# Patient Record
Sex: Male | Born: 1985 | Race: Black or African American | Hispanic: No | Marital: Single | Smoking: Never smoker
Health system: Southern US, Community
[De-identification: ages and names within clinical notes are randomized; demographics above are authoritative.]

## PROBLEM LIST (undated history)

## (undated) HISTORY — PX: HERNIA REPAIR: SHX51

---

## 2011-10-08 ENCOUNTER — Emergency Department (INDEPENDENT_AMBULATORY_CARE_PROVIDER_SITE_OTHER): Payer: Managed Care, Other (non HMO)

## 2011-10-08 ENCOUNTER — Emergency Department (HOSPITAL_BASED_OUTPATIENT_CLINIC_OR_DEPARTMENT_OTHER)
Admission: EM | Admit: 2011-10-08 | Discharge: 2011-10-08 | Disposition: A | Discharge status: Home / Self Care | Payer: Managed Care, Other (non HMO) | Attending: Emergency Medicine | Admitting: Emergency Medicine

## 2011-10-08 ENCOUNTER — Encounter (HOSPITAL_BASED_OUTPATIENT_CLINIC_OR_DEPARTMENT_OTHER): Payer: Self-pay | Admitting: *Deleted

## 2011-10-08 DIAGNOSIS — Y9361 Activity, american tackle football: Secondary | ICD-10-CM | POA: Insufficient documentation

## 2011-10-08 DIAGNOSIS — M25579 Pain in unspecified ankle and joints of unspecified foot: Secondary | ICD-10-CM | POA: Insufficient documentation

## 2011-10-08 DIAGNOSIS — S82899A Other fracture of unspecified lower leg, initial encounter for closed fracture: Secondary | ICD-10-CM | POA: Insufficient documentation

## 2011-10-08 DIAGNOSIS — X500XXA Overexertion from strenuous movement or load, initial encounter: Secondary | ICD-10-CM | POA: Insufficient documentation

## 2011-10-08 DIAGNOSIS — M25473 Effusion, unspecified ankle: Secondary | ICD-10-CM | POA: Insufficient documentation

## 2011-10-08 DIAGNOSIS — S82409A Unspecified fracture of shaft of unspecified fibula, initial encounter for closed fracture: Secondary | ICD-10-CM

## 2011-10-08 DIAGNOSIS — M25476 Effusion, unspecified foot: Secondary | ICD-10-CM | POA: Insufficient documentation

## 2011-10-08 DIAGNOSIS — W219XXA Striking against or struck by unspecified sports equipment, initial encounter: Secondary | ICD-10-CM

## 2011-10-08 MED ORDER — OXYCODONE-ACETAMINOPHEN 5-325 MG PO TABS: 2.0000 | ORAL_TABLET | ORAL | Status: AC | PRN

## 2011-10-08 MED ORDER — OXYCODONE-ACETAMINOPHEN 5-325 MG PO TABS
2.0000 | ORAL_TABLET | Freq: Once | ORAL | Status: DC
  Filled 2011-10-08: qty 2

## 2011-10-08 MED ORDER — IBUPROFEN 800 MG PO TABS
800.0000 mg | ORAL_TABLET | Freq: Once | ORAL | Status: AC
  Administered 2011-10-08: 800 mg via ORAL
  Filled 2011-10-08: qty 1

## 2011-10-08 NOTE — ED Provider Notes (Signed)
History     CSN: [id number]  Arrival date & time 10/08/11  1655   First MD Initiated Contact with Patient 10/08/11 1721      Chief Complaint  Patient presents with  . Ankle Injury    (Consider location/radiation/quality/duration/timing/severity/associated sxs/prior treatment) HPI Comments: Pt states that he was playing football and he twisted his left ankle:pt denies any injury to the area previously:pt was able to walk on it  Patient is a 26 y.o. male presenting with lower extremity injury. The history is provided by the patient. No language interpreter was used.  Ankle Injury This is a new problem. The current episode started today. The problem occurs constantly. The problem has been unchanged.    History reviewed. No pertinent past medical history.  Past Surgical History  Procedure Date  . Hernia repair     History reviewed. No pertinent family history.  History  Substance Use Topics  . Smoking status: Never Smoker   . Smokeless tobacco: Not on file  . Alcohol Use: Yes      Review of Systems  All other systems reviewed and are negative.    Allergies  Review of patient's allergies indicates no known allergies.  Home Medications  No current outpatient prescriptions on file.  BP 132/83  Pulse 86  Temp(Src) 98 F (36.7 C) (Oral)  Resp 18  Ht 5\' 7"  (1.702 m)  Wt 158 lb (71.668 kg)  BMI 24.75 kg/m2  SpO2 97%  Physical Exam  Nursing note and vitals reviewed. Constitutional: He is oriented to person, place, and time. He appears well-developed and well-nourished.  HENT:  Head: Normocephalic.  Eyes: EOM are normal.  Neck: Neck supple.  Cardiovascular: Normal rate and regular rhythm.   Pulmonary/Chest: Effort normal and breath sounds normal.  Musculoskeletal: Normal range of motion.       Pt has obvious swelling noted above the ankle:pt has good JYN:WGNFAO intact:good cap refill  Neurological: He is alert and oriented to person, place, and time.  Skin:  Skin is warm and dry.  Psychiatric: He has a normal mood and affect.    ED Course  Procedures (including critical care time)  Labs Reviewed - No data to display Dg Ankle Complete Left  10/08/2011  *RADIOLOGY REPORT*  Clinical Data: Injured left ankle while playing football.  LEFT ANKLE COMPLETE - 3+ VIEW 10/08/2011:  Comparison: None.  Findings: Oblique, comminuted fracture involving the distal fibula, above the lateral malleolus.  Widening of the medial joint space. No other visible fractures.  Ankle mortise intact.  Dorsal soft tissue swelling.  IMPRESSION: Oblique, comminuted fracture involving the distal fibula, proximal to the lateral malleolus.  Widening of the medial joint space may indicate medial ligament disruption.  No other fractures.  Original Report Authenticated By: Arnell Sieving, M.D.     1. Fibula fracture       MDM  Pt splinted and can follow [id number] intact:pt given something for pain at home        Teressa Lower, NP 10/08/11 1806

## 2011-10-08 NOTE — Discharge Instructions (Signed)
Fibular Fracture, Adult, Treated Without Immobilization You have a fracture (break) of your fibula. This is the bone in your lower leg located on the outside of the leg. These fractures are easily diagnosed with x-rays. TREATMENT  You have a simple fracture of the part of the fibula which is located between the knee and ankle. This bone usually will heal without problems and can often be treated without casting or splinting. This means the fracture will heal well during normal use and daily activities without being held in place. Sometimes a cast or splint is placed on these fractures if it is needed for comfort or if the bones are badly out of place. HOME CARE INSTRUCTIONS   Apply ice to the injury for 15 to 20 minutes, 3 to 4 times per day while awake, for 2 days. Put the ice in a plastic bag and place a thin towel between the bag of ice and your leg. This helps keep swelling down.   Use crutches as directed. Resume walking without crutches as directed by your caregiver or when comfortable doing so.   Only take over-the-counter or prescription medicines for pain, discomfort, or fever as directed by your caregiver.   Your caregiver may tell you to take off your removable cam boot.   Keep appointments for follow up X-rays if these are required.   Warning: Do not drive a car or operate a motor vehicle until your caregiver specifically tells you it is safe to do so.  SEEK MEDICAL CARE IF:   You have continued severe pain or more swelling.   The medications do not control the pain.   Your skin or nails below the injury turn blue or grey or feel cold or numb.   You develop severe pain in the leg or foot.  MAKE SURE YOU:   Understand these instructions.   Will watch your condition.   Will get help right away if you are not doing well or get worse.  Document Released: 08/01/2005 Document Revised: 04/13/2011 Document Reviewed: 03/07/2008 [facility] Patient Information 2012 [place], [place].

## 2011-10-08 NOTE — ED Provider Notes (Signed)
Medical screening examination/treatment/procedure(s) were performed by non-physician practitioner and as supervising physician I was immediately available for consultation/collaboration.  Wessley Emert T Denzal Meir, MD 10/08/11 2025 

## 2011-10-08 NOTE — ED Notes (Signed)
Pt states he was playing football and ?twisted his left ankle. Move toes. Feels touch. Cap refill < 3 sec. Swelling noted.

## 2016-02-11 ENCOUNTER — Emergency Department (HOSPITAL_BASED_OUTPATIENT_CLINIC_OR_DEPARTMENT_OTHER)
Admission: EM | Admit: 2016-02-11 | Discharge: 2016-02-11 | Discharge status: Absent without leave | Payer: Worker's Compensation

## 2017-02-05 ENCOUNTER — Emergency Department (HOSPITAL_BASED_OUTPATIENT_CLINIC_OR_DEPARTMENT_OTHER)
Admission: EM | Admit: 2017-02-05 | Discharge: 2017-02-05 | Disposition: A | Discharge status: Home / Self Care | Payer: 59 | Attending: Emergency Medicine | Admitting: Emergency Medicine

## 2017-02-05 ENCOUNTER — Encounter (HOSPITAL_BASED_OUTPATIENT_CLINIC_OR_DEPARTMENT_OTHER): Payer: Self-pay | Admitting: Emergency Medicine

## 2017-02-05 DIAGNOSIS — Y939 Activity, unspecified: Secondary | ICD-10-CM | POA: Insufficient documentation

## 2017-02-05 DIAGNOSIS — S0990XA Unspecified injury of head, initial encounter: Secondary | ICD-10-CM | POA: Diagnosis present

## 2017-02-05 DIAGNOSIS — Y92410 Unspecified street and highway as the place of occurrence of the external cause: Secondary | ICD-10-CM | POA: Diagnosis not present

## 2017-02-05 DIAGNOSIS — Y999 Unspecified external cause status: Secondary | ICD-10-CM | POA: Diagnosis not present
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
## 2020-07-03 ENCOUNTER — Emergency Department (HOSPITAL_BASED_OUTPATIENT_CLINIC_OR_DEPARTMENT_OTHER)
Admission: EM | Admit: 2020-07-03 | Discharge: 2020-07-03 | Disposition: A | Discharge status: Home / Self Care | Payer: 59 | Attending: Emergency Medicine | Admitting: Emergency Medicine

## 2020-07-03 ENCOUNTER — Emergency Department (HOSPITAL_BASED_OUTPATIENT_CLINIC_OR_DEPARTMENT_OTHER): Payer: 59

## 2020-07-03 ENCOUNTER — Other Ambulatory Visit: Payer: Self-pay

## 2020-07-03 ENCOUNTER — Encounter (HOSPITAL_BASED_OUTPATIENT_CLINIC_OR_DEPARTMENT_OTHER): Payer: Self-pay | Admitting: *Deleted

## 2020-07-03 DIAGNOSIS — S99912A Unspecified injury of left ankle, initial encounter: Secondary | ICD-10-CM | POA: Diagnosis present

## 2020-07-03 DIAGNOSIS — S93402A Sprain of unspecified ligament of left ankle, initial encounter: Secondary | ICD-10-CM | POA: Diagnosis not present

## 2020-07-03 DIAGNOSIS — Y30XXXA Falling, jumping or pushed from a high place, undetermined intent, initial encounter: Secondary | ICD-10-CM | POA: Diagnosis not present

## 2020-07-03 DIAGNOSIS — T1490XA Injury, unspecified, initial encounter: Secondary | ICD-10-CM
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
## 2020-07-03 NOTE — Discharge Instructions (Signed)
Take over-the-counter medications such as Tylenol or ibuprofen. Use the crutches to stay off your ankle. Use the brace to help support your ankle. Follow-up with your orthopedic doctor for further evaluation

## 2020-07-03 NOTE — ED Notes (Signed)
Discharge instructions discussed with patient. Verbalized understanding. Departs ED at this time in stable condition.  
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
## 2020-07-03 NOTE — ED Notes (Signed)
Patient transported to CT 

## 2021-05-28 ENCOUNTER — Ambulatory Visit
Admission: EM | Admit: 2021-05-28 | Discharge: 2021-05-28 | Disposition: A | Discharge status: Home / Self Care | Payer: 59 | Attending: Emergency Medicine | Admitting: Emergency Medicine

## 2021-05-28 DIAGNOSIS — Z113 Encounter for screening for infections with a predominantly sexual mode of transmission: Secondary | ICD-10-CM

## 2021-05-28 NOTE — ED Provider Notes (Signed)
[facility]    CSN: [id number] Arrival date & time: 05/28/21  1202      History   Chief Complaint Chief Complaint  Patient presents with   SEXUALLY TRANSMITTED DISEASE    HPI Bruce Copeland is a 35 y.o. male.   Pt requesting to get STD tested. Pt denies having any symptoms at this time.  Patient reports multiple sex partners.  States he likes to get tested every year.  Patient politely declines HIV and syphilis screening at this time.  The history is provided by the patient.   History reviewed. No pertinent past medical history.  There are no problems to display for this patient.   Past Surgical History:  Procedure Laterality Date   HERNIA REPAIR         Home Medications    Prior to Admission medications   Medication Sig Start Date End Date Taking? Authorizing Provider  ketoconazole (NIZORAL) 2 % cream Apply 1 application topically daily.    [provider]    Family History No family history on file.  Social History Social History   Tobacco Use   Smoking status: Never   Smokeless tobacco: Never  Substance Use Topics   Alcohol use: Yes    Comment: occasionally   Drug use: No     Allergies   Patient has no known allergies.   Review of Systems Review of Systems Pertinent findings noted in history of present illness.    Physical Exam Triage Vital Signs ED Triage Vitals  Enc Vitals Group     BP 05/28/21 1302 135/90     Pulse Rate 05/28/21 1302 68     Resp 05/28/21 1302 20     Temp 05/28/21 1302 98.2 F (36.8 C)     Temp Source 05/28/21 1302 Oral     SpO2 05/28/21 1302 97 %     Weight 05/28/21 1305 185 lb 14.4 oz (84.3 kg)     Height --      Head Circumference --      Peak Flow --      Pain Score 05/28/21 1303 0     Pain Loc --      Pain Edu? --      Excl. in [place]? --    No data found.  Updated Vital Signs BP 135/90 (BP Location: Right Arm)   Pulse 68   Temp 98.2 F (36.8 C) (Oral)   Resp 20   Wt 185 lb 14.4  oz (84.3 kg)   SpO2 97%   BMI 29.12 kg/m   Visual Acuity Right Eye Distance:   Left Eye Distance:   Bilateral Distance:    Right Eye Near:   Left Eye Near:    Bilateral Near:     Physical Exam Vitals and nursing note reviewed.  Constitutional:      Appearance: Normal appearance.  HENT:     Head: Normocephalic and atraumatic.     Right Ear: Tympanic membrane, ear canal and external ear normal.     Left Ear: Tympanic membrane, ear canal and external ear normal.     Nose: Nose normal.     Mouth/Throat:     Mouth: Mucous membranes are moist.     Pharynx: Oropharynx is clear.  Eyes:     Extraocular Movements: Extraocular movements intact.     Conjunctiva/sclera: Conjunctivae normal.     Pupils: Pupils are equal, round, and reactive to light.  Cardiovascular:     Rate and Rhythm: Normal  rate and regular rhythm.     Heart sounds: Normal heart sounds.  Pulmonary:     Effort: Pulmonary effort is normal.     Breath sounds: Normal breath sounds.  Abdominal:     General: Abdomen is flat. Bowel sounds are normal.     Palpations: Abdomen is soft.  Musculoskeletal:        General: Normal range of motion.     Cervical back: Normal range of motion and neck supple.  Skin:    General: Skin is warm and dry.  Neurological:     General: No focal deficit present.     Mental Status: He is alert and oriented to person, place, and time.  Psychiatric:        Mood and Affect: Mood normal.        Behavior: Behavior normal.     UC Treatments / Results  Labs (all labs ordered are listed, but only abnormal results are displayed) Labs Reviewed  URINE CYTOLOGY ANCILLARY ONLY    EKG   Radiology No results found.  Procedures Procedures (including critical care time)  Medications Ordered in UC Medications - No data to display  Initial Impression / Assessment and Plan / UC Course  I have reviewed the triage vital signs and the nursing notes.  Pertinent labs & imaging results that  were available during my care of the patient were reviewed by me and considered in my medical decision making (see chart for details).     STD screening was performed as patient requested.  Patient was advised she will be notified of the results and treatment will be provided if needed.  Patient verbalized understanding and agreement of plan as discussed.  All questions were addressed during visit.  Please see discharge instructions below for further details of plan.  Final Clinical Impressions(s) / UC Diagnoses   Final diagnoses:  Screening for STD (sexually transmitted disease)     Discharge Instructions      We will advise you of the results of your STD screening today once they are available in the next 3 to 5 days.  If any medication is required for treatment, we will provide that for you as well.     ED Prescriptions   None    PDMP not reviewed this encounter.   Theadora Rama Scales, PA-C 05/28/21 1342

## 2021-05-28 NOTE — ED Triage Notes (Signed)
Pt requesting to get STD tested. Pt denies having any symptoms at this time.

## 2021-05-28 NOTE — Discharge Instructions (Addendum)
We will advise you of the results of your STD screening today once they are available in the next 3 to 5 days.  If any medication is required for treatment, we will provide that for you as well.

## 2021-05-31 LAB — URINE CYTOLOGY ANCILLARY ONLY
Chlamydia: NEGATIVE
Comment: NEGATIVE
Comment: NEGATIVE
Comment: NORMAL
Neisseria Gonorrhea: NEGATIVE
Trichomonas: NEGATIVE

## 2021-08-15 IMAGING — DX DG ANKLE COMPLETE 3+V*L*
3 series · 3 of 3 positions shown · non-contrast
Comparison: Radiograph 10/08/2011

CLINICAL DATA: Jumped off truck now with pain in left heel, history
of prior left ankle surgery

EXAM:
LEFT ANKLE COMPLETE - 3+ VIEW

[ankle ap]
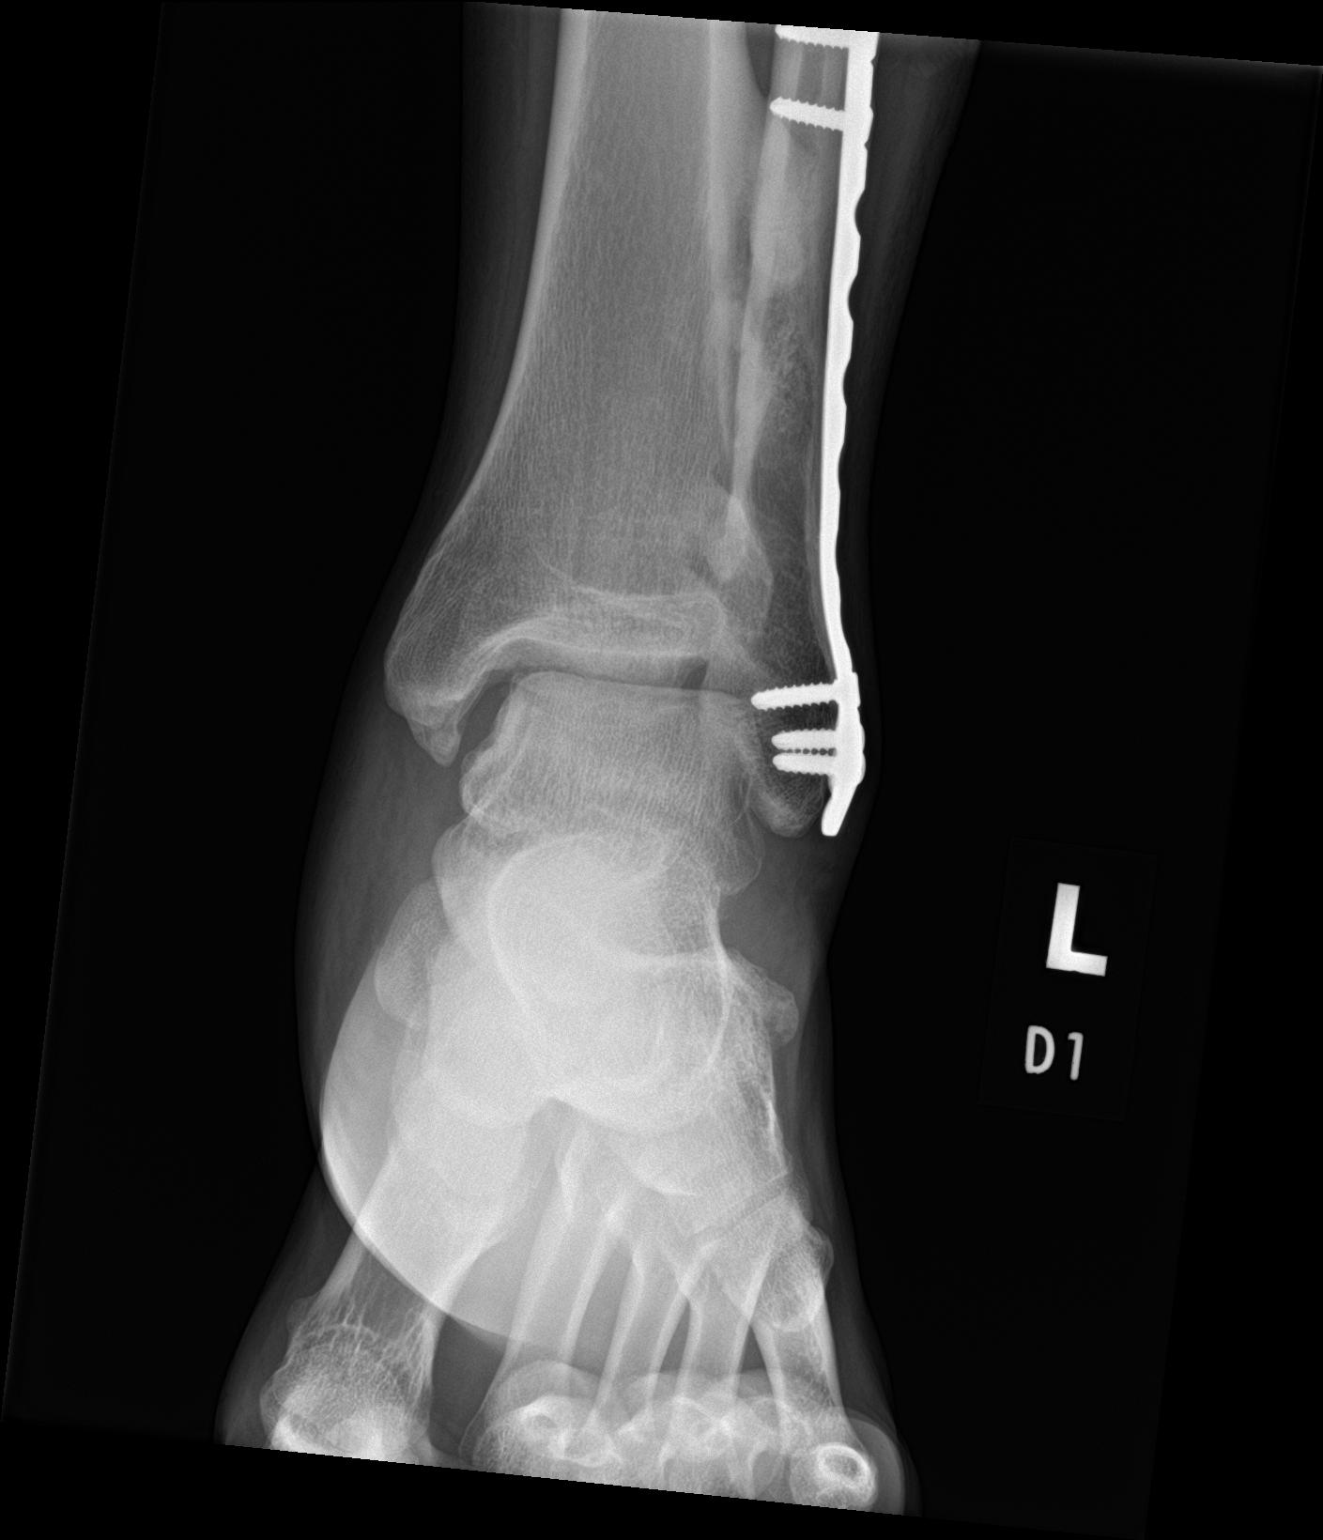

[ankle obl]
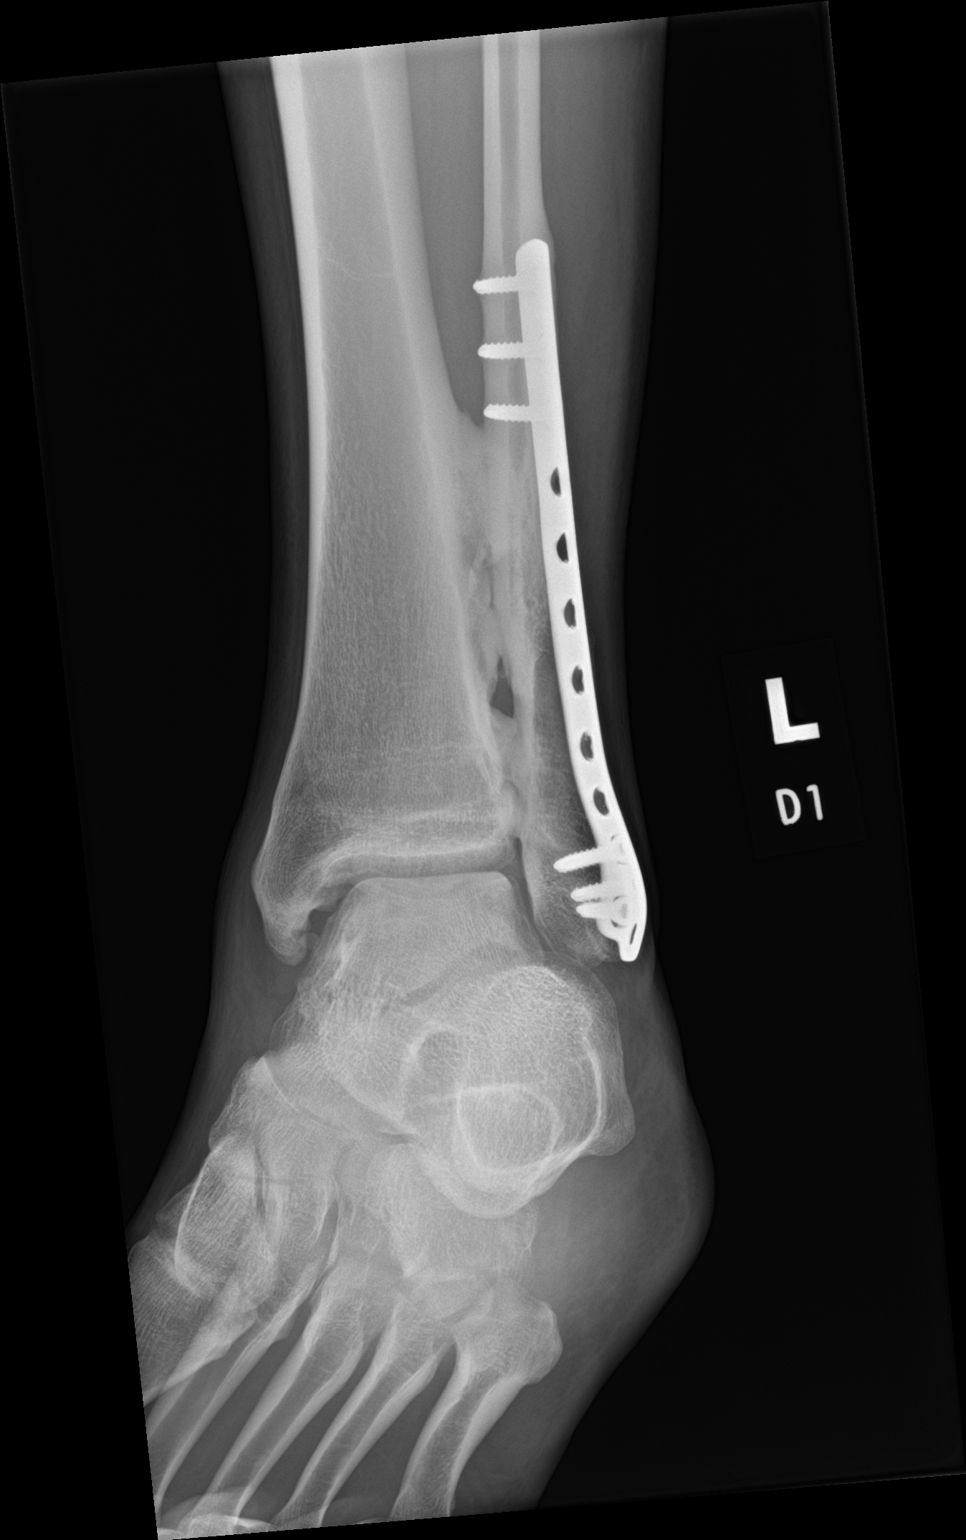

[ankle lat]
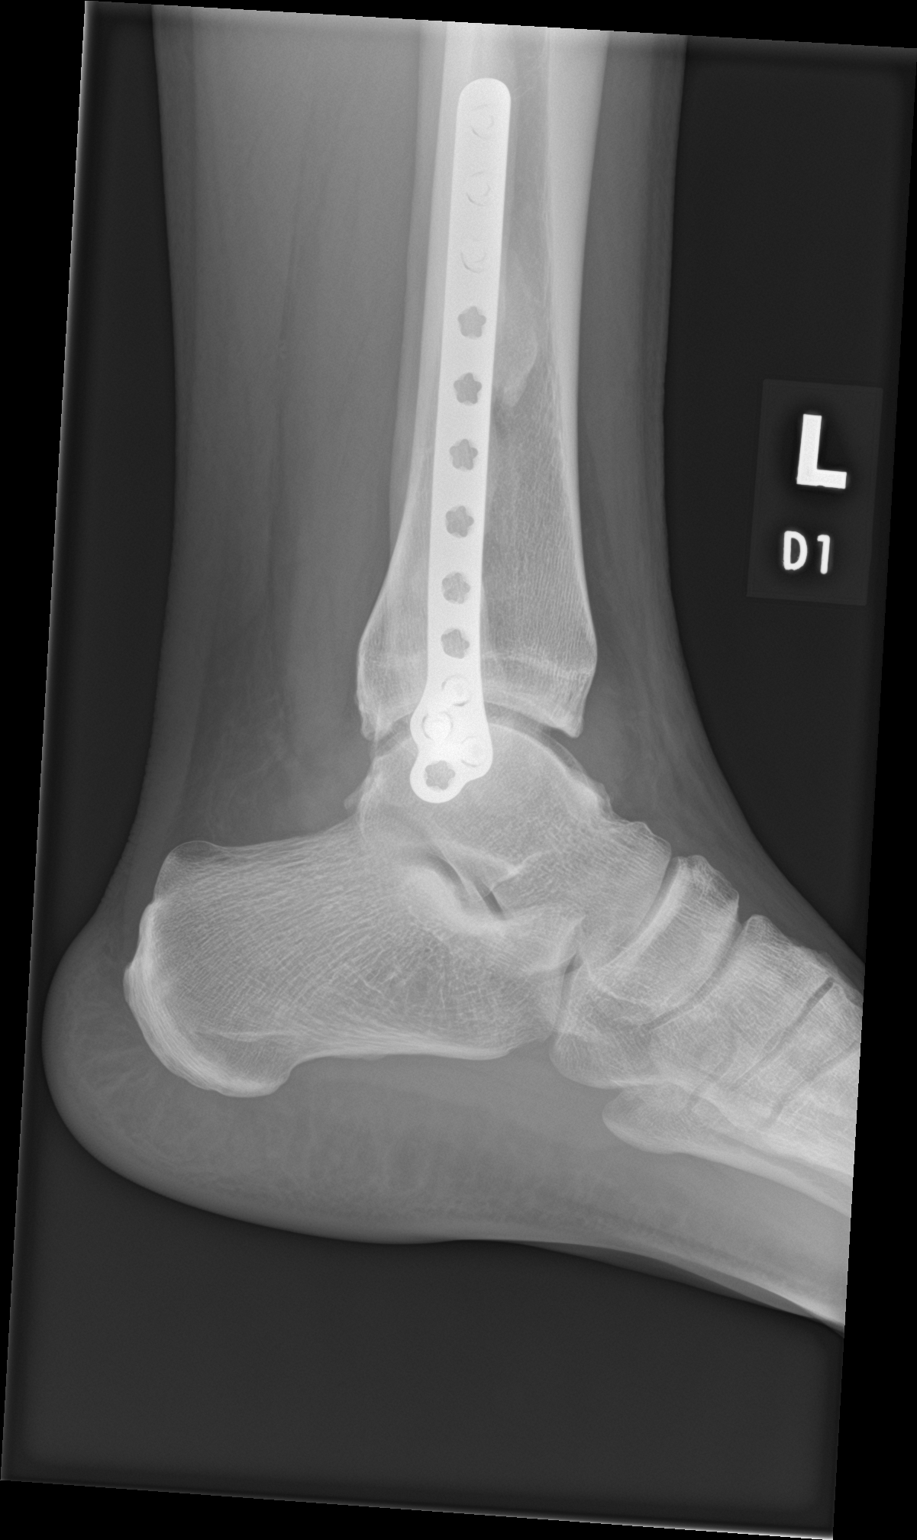

[3 of 3 positions shown; findings below may reference images not displayed]

FINDINGS: Remote postsurgical changes from prior distal fibular diaphyseal
ORIF with lateral plate and screw fixation construct and exuberant
distal tibiofibular syndesmotic ossification. There is age advanced
arthrosis about the talar joint as well as several small corticated
ossifications adjacent the tip of the medial malleolus. A moderate
ankle joint effusion is present. No clear fracture acute or
traumatic osseous malalignment is evident no evidence of a distinct
calcaneal fracture or trabecular disruption. Remaining soft tissues
are unremarkable.
IMPRESSION: 1. No clear fracture or traumatic osseous malalignment. Moderate
ankle joint effusion. Further evaluation may be obtained with
cross-sectional imaging if there is persisting clinical concern.
2. Status post prior distal fibular ORIF. Age advanced arthrosis
about the tibiotalar joint with exuberant distal tibiofibular
syndesmotic ossification. No gross evidence of calcaneal fracture or
trabecular disruption.

## 2021-11-01 ENCOUNTER — Ambulatory Visit
Admission: EM | Admit: 2021-11-01 | Discharge: 2021-11-01 | Disposition: A | Discharge status: Home / Self Care | Payer: 59 | Attending: Emergency Medicine | Admitting: Emergency Medicine

## 2021-11-01 DIAGNOSIS — M5442 Lumbago with sciatica, left side: Secondary | ICD-10-CM | POA: Diagnosis not present

## 2021-11-01 MED ORDER — KETOROLAC TROMETHAMINE 60 MG/2ML IM SOLN
60.0000 mg | Freq: Once | INTRAMUSCULAR | Status: AC
  Administered 2021-11-01: 60 mg via INTRAMUSCULAR

## 2021-11-01 MED ORDER — BACLOFEN 10 MG PO TABS: 10.0000 mg | ORAL_TABLET | Freq: Every day | ORAL | 0 refills | Status: AC

## 2021-11-01 MED ORDER — METHYLPREDNISOLONE 4 MG PO TABS: ORAL_TABLET | ORAL | 0 refills | Status: AC

## 2021-11-01 NOTE — ED Triage Notes (Signed)
Pt c/o lower back pain that radiates to left leg and foot.  ?

## 2021-11-01 NOTE — ED Provider Notes (Signed)
?[facility] ? ? ? ?CSN: [id number] ?Arrival date & time: 11/01/21  1352 ?  ? ?HISTORY  ? ?Chief Complaint  ?Patient presents with  ? Leg Pain  ? ?HPI ?Bruce Copeland is a 36 y.o. male. Patient complains of lower back pain that radiates to his left gluteal muscle, left thigh and sometimes down to his left foot.  Patient also reports intermittent numbness of the top of his left foot.  Patient states he has a history of sciatica but has been more than a year since his last episode.  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  Patient states he has tried taking a laxative, taking ibuprofen and rest with little improvement of his symptoms.  Patient states pain has been going on for little over a week.  Patient denies loss of bowel or bladder function, denies loss of balance or recent fall.  Patient denies traumatic injury to her lower back. ? ?The history is provided by the patient.  ?History reviewed. No pertinent past medical history. ?There are no problems to display for this patient. ? ?Past Surgical History:  ?Procedure Laterality Date  ? HERNIA REPAIR    ? ? ?Home Medications   ? ?Prior to Admission medications   ?Medication Sig Start Date End Date Taking? Authorizing Provider  ?baclofen (LIORESAL) 10 MG tablet Take 1 tablet (10 mg total) by mouth at bedtime for 7 days. 11/01/21 11/08/21 Yes Lynden Oxford Scales, PA-C  ?methylPREDNISolone (MEDROL) 4 MG tablet Take 10 tablets (40 mg total) by mouth daily for 1 day, THEN 9 tablets (36 mg total) daily for 1 day, THEN 8 tablets (32 mg total) daily for 1 day, THEN 7 tablets (28 mg total) daily for 1 day, THEN 6 tablets (24 mg total) daily for 1 day, THEN 5 tablets (20 mg total) daily for 1 day, THEN 4 tablets (16 mg total) daily for 1 day, THEN 3 tablets (12 mg total) daily for 1 day, THEN 2 tablets (8 mg total) daily  for 1 day, THEN 1 tablet (4 mg total) daily for 1 day. 11/01/21 11/11/21 Yes Lynden Oxford Scales, PA-C  ?ketoconazole (NIZORAL) 2 % cream Apply 1 application topically daily.    [provider]  ? ? ?Family History ?History reviewed. No pertinent family history. ?Social History ?Social History  ? ?Tobacco Use  ? Smoking status: Never  ? Smokeless tobacco: Never  ?Substance Use Topics  ? Alcohol use: Yes  ?  Comment: occasionally  ? Drug use: No  ? ?Allergies   ?Patient has no known allergies. ? ?Review of Systems ?Review of Systems ?Pertinent findings noted in history of present illness.  ? ?Physical Exam ?Triage Vital Signs ?ED Triage Vitals  ?Enc Vitals Group  ?   BP 06/11/21 0827 (!) 147/82  ?   Pulse Rate 06/11/21 0827 72  ?   Resp 06/11/21 0827 18  ?   Temp 06/11/21 0827 98.3 ?F (36.8 ?C)  ?   Temp Source 06/11/21 0827 Oral  ?   SpO2 06/11/21 0827 98 %  ?   Weight --   ?   Height --   ?   Head Circumference --   ?   Peak Flow --   ?   Pain Score 06/11/21 0826 5  ?   Pain Loc --   ?  Pain Edu? --   ?   Excl. in [place]? --   ?No data found. ? ?Updated Vital Signs ?BP 135/83 (BP Location: Right Arm)   Pulse 82   Temp 98.6 ?F (37 ?C) (Oral)   Resp 16   SpO2 98%  ? ?Physical Exam ?Vitals and nursing note reviewed.  ?Constitutional:   ?   General: He is not in acute distress. ?   Appearance: Normal appearance. He is not ill-appearing.  ?HENT:  ?   Head: Normocephalic and atraumatic.  ?Eyes:  ?   General: Lids are normal.     ?   Right eye: No discharge.     ?   Left eye: No discharge.  ?   Extraocular Movements: Extraocular movements intact.  ?   Conjunctiva/sclera: Conjunctivae normal.  ?   Right eye: Right conjunctiva is not injected.  ?   Left eye: Left conjunctiva is not injected.  ?Neck:  ?   Trachea: Trachea and phonation normal.  ?Cardiovascular:  ?   Rate and Rhythm: Normal rate and regular rhythm.  ?   Pulses: Normal pulses.  ?   Heart sounds: Normal heart sounds. No murmur heard. ?  No friction  rub. No gallop.  ?Pulmonary:  ?   Effort: Pulmonary effort is normal. No accessory muscle usage, prolonged expiration or respiratory distress.  ?   Breath sounds: Normal breath sounds. No stridor, decreased air movement or transmitted upper airway sounds. No decreased breath sounds, wheezing, rhonchi or rales.  ?Chest:  ?   Chest wall: No tenderness.  ?Musculoskeletal:  ?   Cervical back: Normal range of motion and neck supple. Normal range of motion.  ?   Comments: Patient is able to perform full range of motion of lower back and hips but extension of lower back causes pain.  Positive straight leg raise on the left.  Normal sensation in left leg at this time.  Tenderness to palpation of the left paraspinous muscles with muscle spasm appreciated on palpation.  ?Lymphadenopathy:  ?   Cervical: No cervical adenopathy.  ?Skin: ?   General: Skin is warm and dry.  ?   Findings: No erythema or rash.  ?Neurological:  ?   General: No focal deficit present.  ?   Mental Status: He is alert and oriented to person, place, and time.  ?Psychiatric:     ?   Mood and Affect: Mood normal.     ?   Behavior: Behavior normal.  ? ? ?Visual Acuity ?Right Eye Distance:   ?Left Eye Distance:   ?Bilateral Distance:   ? ?Right Eye Near:   ?Left Eye Near:    ?Bilateral Near:    ? ?UC Couse / Diagnostics / Procedures:  ?  ?EKG ? ?Radiology ?No results found. ? ?Procedures ?Procedures (including critical care time) ? ?UC Diagnoses / Final Clinical Impressions(s)   ?I have reviewed the triage vital signs and the nursing notes. ? ?Pertinent labs & imaging results that were available during my care of the patient were reviewed by me and considered in my medical decision making (see chart for details).   ? ?Final diagnoses:  ?Acute left-sided low back pain with left-sided sciatica  ? ?Patient provided with ketorolac injection in the office today.  Begin tapering dose of methylprednisolone., Take baclofen once daily 1 hour prior to bedtime., Apply  ice pack to affected area 4 times daily for 20 minutes each time, Apply topical anti-inflammatory creams such as Voltaren gel, Capsaicin  and Aspercreme, Avoid stretching or strengthening exercises until pain is completely resolved, and Return to urgent care in the next 2 to 3 days for repeat ketorolac injection if needed ? ? ?ED Prescriptions   ? ? Medication Sig Dispense Auth. Provider  ? methylPREDNISolone (MEDROL) 4 MG tablet Take 10 tablets (40 mg total) by mouth daily for 1 day, THEN 9 tablets (36 mg total) daily for 1 day, THEN 8 tablets (32 mg total) daily for 1 day, THEN 7 tablets (28 mg total) daily for 1 day, THEN 6 tablets (24 mg total) daily for 1 day, THEN 5 tablets (20 mg total) daily for 1 day, THEN 4 tablets (16 mg total) daily for 1 day, THEN 3 tablets (12 mg total) daily for 1 day, THEN 2 tablets (8 mg total) daily for 1 day, THEN 1 tablet (4 mg total) daily for 1 day. 55 tablet Lynden Oxford Scales, PA-C  ? baclofen (LIORESAL) 10 MG tablet Take 1 tablet (10 mg total) by mouth at bedtime for 7 days. 7 tablet Lynden Oxford Scales, PA-C  ? ?  ? ?PDMP not reviewed this encounter. ? ?Pending results:  ?Labs Reviewed - No data to display ? ?Medications Ordered in UC: ?Medications  ?ketorolac (TORADOL) injection 60 mg (has no administration in time range)  ? ? ?Disposition Upon Discharge:  ?Condition: stable for discharge home ?Home: take medications as prescribed; routine discharge instructions as discussed; follow up as advised. ? ?Patient presented with an acute illness with associated systemic symptoms and significant discomfort requiring urgent management. In my opinion, this is a condition that a prudent lay person (someone who possesses an average knowledge of health and medicine) may potentially expect to result in complications if not addressed urgently such as respiratory distress, impairment of bodily function or dysfunction of bodily organs.  ? ?Routine symptom specific, illness specific  and/or disease specific instructions were discussed with the patient and/or caregiver at length.  ? ?As such, the patient has been evaluated and assessed, work-up was performed and treatment was provided in

## 2021-11-01 NOTE — Discharge Instructions (Signed)
During your visit today, you received an injection of ketorolac, high-dose nonsteroidal anti-inflammatory pain medication that should significantly reduce your pain for the next 6 to 8 hours. ?  ?This evening, please begin taking baclofen 10 mg.  This is a highly effective muscle relaxer and antispasmodic which should continue to provide you with relaxation of your tense muscles, allow you to sleep well and to keep your pain under control.  Please plan to take this medication 1-2 hours before bedtime for the next 7 nights. ?  ?Tomorrow morning, please begin taking methylprednisolone.  Please take all tablets of the daily recommended dose with your breakfast meal.  If you have had significant relation of your pain before you finish the entire prescription, please take the next recommended dose and feel free to discontinue.  It is not important to finish every dose, only every dose that you need. ?  ?During the day, please make appoint to apply ice to the affected area, in your case your left lower back, 4 times daily for 20 minutes each application.  This can be achieved by using a bag of frozen peas or corn, a Ziploc bag filled with ice and water, or Ziploc bag filled with half rubbing alcohol and half Dawn dish detergent, frozen into a slush.  Please be careful not to apply ice directly to your skin, always place a soft cloth between you and the ice pack. ?  ?You are welcome to use topical anti-inflammatory creams such as Voltaren gel, capsaicin or Aspercreme as recommended by the manufacturer in the same area. ?  ?Please avoid attempts to stretch or strengthen the affected area until you are feeling completely pain-free.  Attempts to do so will only prolong the healing process. ?  ?If you would like to try to return return to urgent care in the next 2 to 3 days for repeat ketorolac injection, you are welcome to do so. ?  ?I also recommend that you remain out of work for the next several days, I provided you with a  note to return to work in 3 days.  If you feel that you need this time extended, please follow-up with your primary care provider or return to urgent care for reevaluation so that we can provide you with a note for another 3 days. ? ?Thank you for visiting urgent care today.  I appreciate the opportunity to participate in your care. ?

## 2022-07-27 ENCOUNTER — Ambulatory Visit
Admission: EM | Admit: 2022-07-27 | Discharge: 2022-07-27 | Disposition: A | Discharge status: Home / Self Care | Payer: 59 | Attending: Urgent Care | Admitting: Urgent Care

## 2022-07-27 DIAGNOSIS — J019 Acute sinusitis, unspecified: Secondary | ICD-10-CM

## 2022-07-27 MED ORDER — PROMETHAZINE-DM 6.25-15 MG/5ML PO SYRP: 5.0000 mL | ORAL_SOLUTION | Freq: Every evening | ORAL | 0 refills | Status: AC | PRN

## 2022-07-27 MED ORDER — CETIRIZINE HCL 10 MG PO TABS: 10.0000 mg | ORAL_TABLET | Freq: Every day | ORAL | 0 refills | Status: AC

## 2022-07-27 MED ORDER — AMOXICILLIN 875 MG PO TABS: 875.0000 mg | ORAL_TABLET | Freq: Two times a day (BID) | ORAL | 0 refills | Status: AC

## 2022-07-27 MED ORDER — PSEUDOEPHEDRINE HCL 60 MG PO TABS: 60.0000 mg | ORAL_TABLET | Freq: Three times a day (TID) | ORAL | 0 refills | Status: AC | PRN

## 2022-07-27 NOTE — ED Provider Notes (Signed)
[facility]  Note:  This document was prepared using Conservation officer, historic buildings and may include unintentional dictation errors.  MRN: [id number] DOB: 1986/02/28  Subjective:   Ferguson Hann is a 36 y.o. male presenting for 10 day history of acute onset persistent malaise, fatigue, sinus congestion, sinus pain, facial pressure, coughing with chills and body pains.  Patient has done multiple COVID tests and all have been negative.  He is use supportive care, TheraFlu.  No history of respiratory disorders, asthma.  Patient does not smoke cigarettes, no marijuana or vaping.  No chronic medications.    No Known Allergies  History reviewed. No pertinent past medical history.   Past Surgical History:  Procedure Laterality Date   HERNIA REPAIR      History reviewed. No pertinent family history.  Social History   Tobacco Use   Smoking status: Never   Smokeless tobacco: Never  Substance Use Topics   Alcohol use: Yes    Comment: occasionally   Drug use: No    ROS   Objective:   Vitals: BP (!) 145/96 (BP Location: Right Arm)   Pulse 89   Temp 98.8 F (37.1 C) (Oral)   Resp 18   SpO2 96%   Physical Exam Constitutional:      General: He is not in acute distress.    Appearance: Normal appearance. He is well-developed and normal weight. He is not ill-appearing, toxic-appearing or diaphoretic.  HENT:     Head: Normocephalic and atraumatic.     Right Ear: Tympanic membrane, ear canal and external ear normal. No drainage, swelling or tenderness. No middle ear effusion. There is no impacted cerumen. Tympanic membrane is not erythematous or bulging.     Left Ear: Tympanic membrane, ear canal and external ear normal. No drainage, swelling or tenderness.  No middle ear effusion. There is no impacted cerumen. Tympanic membrane is not erythematous or bulging.     Nose: Nose normal. No congestion or rhinorrhea.     Mouth/Throat:     Mouth: Mucous  membranes are moist.     Pharynx: No oropharyngeal exudate or posterior oropharyngeal erythema.  Eyes:     General: No scleral icterus.       Right eye: No discharge.        Left eye: No discharge.     Extraocular Movements: Extraocular movements intact.     Conjunctiva/sclera: Conjunctivae normal.  Cardiovascular:     Rate and Rhythm: Normal rate and regular rhythm.     Heart sounds: Normal heart sounds. No murmur heard.    No friction rub. No gallop.  Pulmonary:     Effort: Pulmonary effort is normal. No respiratory distress.     Breath sounds: Normal breath sounds. No stridor. No wheezing, rhonchi or rales.  Musculoskeletal:     Cervical back: Normal range of motion and neck supple. No rigidity. No muscular tenderness.  Neurological:     General: No focal deficit present.     Mental Status: He is alert and oriented to person, place, and time.  Psychiatric:        Mood and Affect: Mood normal.        Behavior: Behavior normal.        Thought Content: Thought content normal.        Judgment: Judgment normal.     Assessment and Plan :   PDMP not reviewed this encounter.  1. Acute sinusitis, recurrence not specified, unspecified location  Deferred imaging given clear cardiopulmonary exam, hemodynamically stable vital signs. Will start empiric treatment for sinusitis with amoxicillin.  Recommended supportive care otherwise including the use of oral antihistamine, decongestant. Counseled patient on potential for adverse effects with medications prescribed/recommended today, ER and return-to-clinic precautions discussed, patient verbalized understanding.    Wallis Bamberg, PA-C 07/27/22 1249

## 2022-07-27 NOTE — ED Triage Notes (Addendum)
Pt c/o chills (yesterday) and cough since last week.   Home interventions: Theraflu   Last night at home Covid test was negative.
